# Patient Record
Sex: Male | Born: 1965 | Race: White | Hispanic: No | Marital: Married | Smoking: Never smoker
Health system: Southern US, Community
[De-identification: ages and names within clinical notes are randomized; demographics above are authoritative.]

## PROBLEM LIST (undated history)

## (undated) DIAGNOSIS — J302 Other seasonal allergic rhinitis: Secondary | ICD-10-CM

## (undated) DIAGNOSIS — Z720 Tobacco use: Secondary | ICD-10-CM

## (undated) DIAGNOSIS — E785 Hyperlipidemia, unspecified: Secondary | ICD-10-CM

## (undated) DIAGNOSIS — K219 Gastro-esophageal reflux disease without esophagitis: Secondary | ICD-10-CM

## (undated) DIAGNOSIS — J45909 Unspecified asthma, uncomplicated: Secondary | ICD-10-CM

## (undated) HISTORY — DX: Tobacco use: Z72.0

## (undated) HISTORY — DX: Gastro-esophageal reflux disease without esophagitis: K21.9

## (undated) HISTORY — DX: Other seasonal allergic rhinitis: J30.2

## (undated) HISTORY — DX: Hyperlipidemia, unspecified: E78.5

---

## 2017-10-31 DIAGNOSIS — J302 Other seasonal allergic rhinitis: Secondary | ICD-10-CM | POA: Diagnosis not present

## 2017-10-31 DIAGNOSIS — J4520 Mild intermittent asthma, uncomplicated: Secondary | ICD-10-CM | POA: Diagnosis not present

## 2017-10-31 DIAGNOSIS — Z1389 Encounter for screening for other disorder: Secondary | ICD-10-CM | POA: Diagnosis not present

## 2017-10-31 DIAGNOSIS — H6121 Impacted cerumen, right ear: Secondary | ICD-10-CM | POA: Diagnosis not present

## 2017-10-31 DIAGNOSIS — E7800 Pure hypercholesterolemia, unspecified: Secondary | ICD-10-CM | POA: Diagnosis not present

## 2017-10-31 DIAGNOSIS — Z1212 Encounter for screening for malignant neoplasm of rectum: Secondary | ICD-10-CM | POA: Diagnosis not present

## 2017-10-31 DIAGNOSIS — Z0000 Encounter for general adult medical examination without abnormal findings: Secondary | ICD-10-CM | POA: Diagnosis not present

## 2017-10-31 DIAGNOSIS — Z125 Encounter for screening for malignant neoplasm of prostate: Secondary | ICD-10-CM | POA: Diagnosis not present

## 2017-11-26 DIAGNOSIS — J019 Acute sinusitis, unspecified: Secondary | ICD-10-CM | POA: Diagnosis not present

## 2017-12-05 DIAGNOSIS — J302 Other seasonal allergic rhinitis: Secondary | ICD-10-CM | POA: Diagnosis not present

## 2017-12-06 DIAGNOSIS — L509 Urticaria, unspecified: Secondary | ICD-10-CM | POA: Diagnosis not present

## 2018-02-04 ENCOUNTER — Encounter (HOSPITAL_COMMUNITY): Payer: Self-pay | Admitting: Emergency Medicine

## 2018-02-04 ENCOUNTER — Emergency Department (HOSPITAL_COMMUNITY)
Admission: EM | Admit: 2018-02-04 | Discharge: 2018-02-04 | Disposition: A | Discharge status: Home / Self Care | Payer: Worker's Compensation | Attending: Emergency Medicine | Admitting: Emergency Medicine

## 2018-02-04 ENCOUNTER — Emergency Department (HOSPITAL_COMMUNITY): Payer: Worker's Compensation

## 2018-02-04 ENCOUNTER — Other Ambulatory Visit: Payer: Self-pay

## 2018-02-04 DIAGNOSIS — M79605 Pain in left leg: Secondary | ICD-10-CM | POA: Diagnosis present

## 2018-02-04 DIAGNOSIS — J45909 Unspecified asthma, uncomplicated: Secondary | ICD-10-CM | POA: Insufficient documentation

## 2018-02-04 DIAGNOSIS — R52 Pain, unspecified: Secondary | ICD-10-CM

## 2018-02-04 HISTORY — DX: Unspecified asthma, uncomplicated: J45.909

## 2018-02-04 LAB — D-DIMER, QUANTITATIVE (NOT AT ARMC): D DIMER QUANT: 1.05 ug{FEU}/mL — AB (ref 0.00–0.50)
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
## 2018-02-04 NOTE — Discharge Instructions (Addendum)
IMPORTANT PATIENT INSTRUCTIONS:  You have been scheduled for an Outpatient Vascular Study at [facility].   ° °If tomorrow is a Saturday, Sunday or holiday, please go to the [facility] Registration Desk at 8 am tomorrow morning and tell them you are there for a vascular study.  If tomorrow is a weekday (Monday-Friday), please go to [facility]mitting [facility] at 8 am and tell them  °you are  °there for a vascular study. ° °

## 2018-02-05 ENCOUNTER — Ambulatory Visit (HOSPITAL_COMMUNITY)
Admission: RE | Admit: 2018-02-05 | Discharge: 2018-02-05 | Disposition: A | Discharge status: Home / Self Care | Payer: Worker's Compensation | Source: Ambulatory Visit | Attending: Emergency Medicine | Admitting: Emergency Medicine

## 2018-02-05 DIAGNOSIS — M79662 Pain in left lower leg: Secondary | ICD-10-CM | POA: Diagnosis not present

## 2018-02-05 DIAGNOSIS — R52 Pain, unspecified: Secondary | ICD-10-CM | POA: Diagnosis not present

## 2018-02-05 NOTE — Progress Notes (Signed)
*  Preliminary Results* Left lower extremity venous duplex completed. Left lower extremity is negative for deep vein thrombosis. There is evidence of superficial vein thrombosis involving the left small saphenous vein. There is no evidence of left Baker's cyst.  Preliminary results discussed with Dr. Jacqulyn Bath- EDP.  02/05/2018 8:53 AM  Gertie Fey, MHA, RVT, RDCS, RDMS
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
## 2022-03-01 ENCOUNTER — Encounter: Payer: Self-pay | Admitting: Internal Medicine

## 2022-03-01 ENCOUNTER — Ambulatory Visit: Payer: Commercial Managed Care - PPO | Admitting: Internal Medicine

## 2022-04-03 ENCOUNTER — Encounter: Payer: Self-pay | Admitting: Internal Medicine

## 2022-04-03 ENCOUNTER — Ambulatory Visit: Payer: Commercial Managed Care - PPO | Admitting: Internal Medicine

## 2022-04-03 VITALS — BP 140/78 | HR 117 | Temp 98.1°F | Resp 18 | Ht 71.0 in | Wt 179.4 lb

## 2022-04-03 DIAGNOSIS — R051 Acute cough: Secondary | ICD-10-CM | POA: Insufficient documentation

## 2022-04-03 MED ORDER — PREDNISONE 20 MG PO TABS: 20.0000 mg | ORAL_TABLET | Freq: Every day | ORAL | 0 refills | Status: AC

## 2022-04-03 MED ORDER — HYDROCOD POLI-CHLORPHE POLI ER 10-8 MG/5ML PO SUER: 5.0000 mL | Freq: Two times a day (BID) | ORAL | 0 refills | Status: DC | PRN

## 2022-04-03 NOTE — Assessment & Plan Note (Signed)
He had a previous CXR by urgent care and he states this was clear.  He received antibiotics and at this point he is not having productive cough and he is not wheezing on my exam.  He does have some mild fluid and sinus congestion on questioning and I asked him to continue on flonase.  We will give him a short course of prednisone 20mg  daily x 5 days.  He does not want a repeat CXR but I do not think he has pneumonia on my exam.  We will give him tussionex for cough at night.  Continue supportive care.

## 2022-04-03 NOTE — Progress Notes (Signed)
Office Visit  Subjective   Patient ID: Mattew Chriswell   DOB: 10-05-1965   Age: 56 y.o.   MRN: [id number]   Chief Complaint Chief Complaint  Patient presents with   Acute Visit    cough     History of Present Illness The patient is a 56 year old male who presents with upper respiratory tract symptoms which began about 5 weeks ago where urgent care diagnosed him with influenza.  He did start out of tamilflu.  He continued to have cough and runny nose of both green and yellow discharge.  He went back to the urgent maybe a week later and they gave him antibiotics with levaquin.  He states his sinuses improved but he still has cough that is non-productive.  Today, he denies any fever, chills, chest congestion, SOB, wheezing, nausea, vomiting, diarrhea, sinus congestion or other problems.   Alleviating factors: he has tried robitussin but this is not helping.  He just started on flonase about 4 days ago. The patient's past medical history is significant for seasonal allergies and asthma.  He has been trying to use his albuterol as he thought maybe it was his asthma but this has not been helping.       Past Medical History Past Medical History:  Diagnosis Date   Asthma    GERD (gastroesophageal reflux disease)    Hyperlipidemia    Seasonal allergies    Snuff user      Allergies Allergies  Allergen Reactions   Omnicef [Cefdinir] Hives     Review of Systems Review of Systems  Constitutional:  Negative for chills and fever.  Respiratory:  Positive for cough. Negative for sputum production, shortness of breath and wheezing.   Cardiovascular:  Negative for chest pain, palpitations and leg swelling.  Gastrointestinal:  Negative for constipation, diarrhea, nausea and vomiting.  Musculoskeletal:  Negative for myalgias.  Skin:  Negative for itching and rash.  Neurological:  Negative for dizziness, weakness and headaches.       Objective:    Vitals BP (!) 140/78   Pulse (!) 117    Temp 98.1 F (36.7 C)   Resp 18   Ht 5\' 11"  (1.803 m)   Wt 179 lb 6.4 oz (81.4 kg)   SpO2 98%   BMI 25.02 kg/m    Physical Examination Physical Exam Constitutional:      Appearance: Normal appearance. He is not ill-appearing.  Cardiovascular:     Rate and Rhythm: Normal rate and regular rhythm.     Pulses: Normal pulses.     Heart sounds: No murmur heard.    No friction rub. No gallop.  Pulmonary:     Effort: Pulmonary effort is normal. No respiratory distress.     Breath sounds: No wheezing, rhonchi or rales.  Abdominal:     General: Abdomen is flat. Bowel sounds are normal. There is no distension.     Palpations: Abdomen is soft.     Tenderness: There is no abdominal tenderness.  Skin:    General: Skin is warm and dry.     Findings: No rash.  Neurological:     Mental Status: He is alert.        Assessment & Plan:   Acute cough He had a previous CXR by urgent care and he states this was clear.  He received antibiotics and at this point he is not having productive cough and he is not wheezing on my exam.  He does have some  mild fluid and sinus congestion on questioning and I asked him to continue on flonase.  We will give him a short course of prednisone 20mg  daily x 5 days.  He does not want a repeat CXR but I do not think he has pneumonia on my exam.  We will give him tussionex for cough at night.  Continue supportive care.    No follow-ups on file.   , MD

## 2022-05-06 ENCOUNTER — Other Ambulatory Visit: Payer: Self-pay | Admitting: Urology

## 2022-05-06 DIAGNOSIS — R972 Elevated prostate specific antigen [PSA]: Secondary | ICD-10-CM

## 2022-06-02 ENCOUNTER — Other Ambulatory Visit: Payer: Self-pay | Admitting: Internal Medicine

## 2022-06-08 ENCOUNTER — Ambulatory Visit
Admission: RE | Admit: 2022-06-08 | Discharge: 2022-06-08 | Disposition: A | Discharge status: Home / Self Care | Payer: Commercial Managed Care - PPO | Source: Ambulatory Visit | Attending: Urology | Admitting: Urology

## 2022-06-08 DIAGNOSIS — R972 Elevated prostate specific antigen [PSA]: Secondary | ICD-10-CM

## 2022-06-08 MED ORDER — GADOPICLENOL 0.5 MMOL/ML IV SOLN
8.0000 mL | Freq: Once | INTRAVENOUS | Status: AC | PRN
  Administered 2022-06-08: 8 mL via INTRAVENOUS

## 2022-06-12 ENCOUNTER — Other Ambulatory Visit: Payer: Self-pay | Admitting: Internal Medicine

## 2022-08-02 ENCOUNTER — Encounter: Payer: Self-pay | Admitting: Internal Medicine

## 2022-08-02 ENCOUNTER — Ambulatory Visit: Payer: Commercial Managed Care - PPO | Admitting: Internal Medicine

## 2022-08-02 VITALS — BP 120/88 | HR 67 | Temp 98.7°F | Resp 16 | Ht 71.0 in | Wt 171.4 lb

## 2022-08-02 DIAGNOSIS — E7800 Pure hypercholesterolemia, unspecified: Secondary | ICD-10-CM | POA: Insufficient documentation

## 2022-08-02 NOTE — Assessment & Plan Note (Signed)
He remains on pravastatin.  We will check a FLP on him today.

## 2022-08-02 NOTE — Progress Notes (Signed)
   Office Visit  Subjective   Patient ID: Larry Whitehead   DOB: 04/01/1966   Age: 57 y.o.   MRN: [id number]   Chief Complaint Chief Complaint  Patient presents with  . Follow-up    Cholesterol     History of Present Illness Larry Whitehead returns today for routine followup on his cholesterol.  We saw him 6 months ago showed a TC 188 and a LDL of 112.  and His ASCVD score from his 05/2019 FLP was 9.1% so we started him on pravastatin.  Overall, he states he is doing well and is without any complaints or problems at this time. He specifically denies abdominal pain, nausea, vomiting, diarrhea, myalgias, and fatigue. He remains on diet and pravastatin  po qhs.  He is fasting in anticipation for labs today.       Past Medical History Past Medical History:  Diagnosis Date  . Asthma   . GERD (gastroesophageal reflux disease)   . Hyperlipidemia   . Seasonal allergies   . Snuff user      Allergies Allergies  Allergen Reactions  . Omnicef [Cefdinir] Hives     Medications  Current Outpatient Medications:  .  pantoprazole (PROTONIX) 40 MG tablet, TAKE 1 TABLET BY MOUTH EVERY DAY, Disp: 90 tablet, Rfl: 1 .  pravastatin (PRAVACHOL) 40 MG tablet, TAKE 1 TABLET BY MOUTH EVERYDAY AT BEDTIME, Disp: 90 tablet, Rfl: 1   Review of Systems Review of Systems  Constitutional:  Negative for chills and fever.  Respiratory:  Negative for cough and shortness of breath.   Cardiovascular:  Negative for chest pain, palpitations and leg swelling.  Gastrointestinal:  Negative for abdominal pain, constipation, diarrhea, nausea and vomiting.  Musculoskeletal:  Negative for myalgias.  Neurological:  Negative for dizziness, weakness and headaches.       Objective:    Vitals BP 120/88   Pulse 67   Temp 98.7 F (37.1 C)   Resp 16   Ht  (1.803 m)   Wt 171 lb 6.4 oz (77.7 kg)   SpO2 99%   BMI 23.91 kg/m    Physical Examination Physical Exam Constitutional:      Appearance:  Normal appearance. He is not ill-appearing.  Cardiovascular:     Rate and Rhythm: Normal rate and regular rhythm.     Pulses: Normal pulses.     Heart sounds: No murmur heard.    No friction rub. No gallop.  Pulmonary:     Effort: Pulmonary effort is normal. No respiratory distress.     Breath sounds: No wheezing, rhonchi or rales.  Abdominal:     General: Abdomen is flat. Bowel sounds are normal. There is no distension.     Palpations: Abdomen is soft.     Tenderness: There is no abdominal tenderness.  Musculoskeletal:     Right lower leg: No edema.     Left lower leg: No edema.  Skin:    General: Skin is warm and dry.     Findings: No rash.  Neurological:     Mental Status: He is alert.       Assessment & Plan:   Hypercholesterolemia He remains on pravastatin.  We will check a FLP on him today.    Return in about 27 weeks (around 02/07/2023) for annual.   Crist Fat, MD

## 2022-08-06 LAB — LP+CBC/D/PLT+PSA+TESTT+E2+T...
Cholesterol, Total: 183 mg/dL (ref 100–199)
Estradiol: 15.7 pg/mL (ref 7.6–42.6)
HDL: 59 mg/dL (ref >39)
LDL Chol Calc (NIH): 102 mg/dL — ABNORMAL HIGH (ref 0–99)
Prostate Specific Ag, Serum: 3.7 ng/mL (ref 0.0–4.0)
Testosterone, Free: 4.6 pg/mL — ABNORMAL LOW (ref 7.2–24.0)
Testosterone: 314 ng/dL (ref 264–916)
Triglycerides: 127 mg/dL (ref 0–149)
VLDL Cholesterol Cal: 22 mg/dL (ref 5–40)

## 2022-08-06 LAB — LIPID PANEL: Chol/HDL Ratio: 3.1 ratio (ref 0.0–5.0)

## 2022-12-01 ENCOUNTER — Other Ambulatory Visit: Payer: Self-pay | Admitting: Internal Medicine

## 2023-02-14 ENCOUNTER — Ambulatory Visit: Payer: Commercial Managed Care - PPO | Admitting: Internal Medicine

## 2023-02-14 ENCOUNTER — Encounter: Payer: Self-pay | Admitting: Internal Medicine

## 2023-02-14 VITALS — BP 128/84 | HR 92 | Temp 97.9°F | Resp 18 | Ht 71.0 in | Wt 179.6 lb

## 2023-02-14 DIAGNOSIS — F17220 Nicotine dependence, chewing tobacco, uncomplicated: Secondary | ICD-10-CM | POA: Insufficient documentation

## 2023-02-14 DIAGNOSIS — E782 Mixed hyperlipidemia: Secondary | ICD-10-CM | POA: Insufficient documentation

## 2023-02-14 DIAGNOSIS — Z6825 Body mass index (BMI) 25.0-25.9, adult: Secondary | ICD-10-CM | POA: Insufficient documentation

## 2023-02-14 DIAGNOSIS — Z0000 Encounter for general adult medical examination without abnormal findings: Secondary | ICD-10-CM | POA: Insufficient documentation

## 2023-02-14 DIAGNOSIS — N400 Enlarged prostate without lower urinary tract symptoms: Secondary | ICD-10-CM | POA: Insufficient documentation

## 2023-02-14 DIAGNOSIS — K219 Gastro-esophageal reflux disease without esophagitis: Secondary | ICD-10-CM | POA: Insufficient documentation

## 2023-02-14 DIAGNOSIS — J4520 Mild intermittent asthma, uncomplicated: Secondary | ICD-10-CM | POA: Insufficient documentation

## 2023-02-14 DIAGNOSIS — J302 Other seasonal allergic rhinitis: Secondary | ICD-10-CM | POA: Insufficient documentation

## 2023-02-14 MED ORDER — PANTOPRAZOLE SODIUM 40 MG PO TBEC: 40.0000 mg | DELAYED_RELEASE_TABLET | Freq: Every day | ORAL | 1 refills | Status: DC

## 2023-02-14 NOTE — Progress Notes (Incomplete)
Office Visit  Subjective   Patient ID: Larry Whitehead   DOB: 1965-09-28   Age: 57 y.o.   MRN: [id number]   Chief Complaint Chief Complaint  Patient presents with  . Annual Exam     History of Present Illness Larry Whitehead is a 57 year old Caucasian/White male who presents for his annual health maintenance exam. He is due for the following health maintenance studies: visual exam and screening labs. This patient's past medical history Asthma, GERD, Hyperlipidemia, Rhinitis, Allergic, and Tobacco Abuse.   His last eye exam was in 10/2021 where they gave him new prescription glasses. There has been no change in his vision and they are following for a cataract. 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 He goes back tomorrow for another eye exam.  He had a colonoscopy in 10/2014 and this was normal and they want to repeat it again in 10 years. He denies any problems with urination. He has no history of BPH. He does exercise with walking 3-4 times per week. He does get yearly flu vaccines.  He finished his shingrix vaccine on 06/14/2022.  He has had 1 J&J COVID-19 vaccine. He denies any depression or anxiety. He is on an ASA 81mg  daily.   Larry Whitehead returns today for routine followup on his cholesterol.  We saw him this past year where his FLP showed a TC 188 and a LDL of 112.  and His ASCVD score from his 05/2019 FLP was 9.1% so we started him on pravastatin.  Overall, he states he is doing well and is without any complaints or problems at this time. He specifically denies abdominal pain, nausea, vomiting, diarrhea, myalgias, and fatigue. He remains on diet and pravastatin 40mg  po qhs.  He is fasting in anticipation for labs today.    He does have a history of allergies which usually affects in the spring and fall and he takes OTC Flonase as needed.  He remains on protonix  which he states does control his reflux.    The patient  also presents mild asthma of years duration and presents today for a status visit.  He states he was diagnosed with this about 15 years.  His asthma has been controlled and he has had to use it rarely this past year.   See PMH for summary of pulmonary history and lab reports for status of any previous PFTs: Asthma and Rhinitis, Allergic. Comorbid conditions : none. He has no baseline symptoms of asthma. The patient's condition is controlled by medications as summarized in the medication list on the face sheet. He has no modifiable risk factors. Specifically denied complaints: worsening exertional dyspnea, increased wheezing, pleuritic chest pain, hemoptysis, fever, and nocturnal wheezing. Interval history: no healthcare visits with other providers since last seen in this office. Interval studies: none.   He does continue to dip snuff and is ready to quit after he has had multiple discussions with his dentist.  He has tried Chantix but he stopped it due to nausea.  He also had vivid dreams with this.     Past Medical History Past Medical History:  Diagnosis Date  . Asthma   . GERD (gastroesophageal reflux disease)   . Hyperlipidemia   . Seasonal allergies   . Snuff user      Allergies Allergies  Allergen Reactions  . Omnicef [Cefdinir] Hives     Medications  Current Outpatient Medications:  .  tamsulosin (FLOMAX) 0.4 MG CAPS capsule, Take 0.4 mg by mouth at bedtime., Disp: , Rfl:  .  pantoprazole (PROTONIX) 40 MG tablet, TAKE 1 TABLET BY MOUTH EVERY DAY, Disp: 90 tablet, Rfl: 1 .  pravastatin (PRAVACHOL) 40 MG tablet, TAKE 1 TABLET BY MOUTH EVERYDAY AT BEDTIME, Disp: 90 tablet, Rfl: 1   Review of Systems Review of Systems  Constitutional:  Negative for chills, fever and malaise/fatigue.  Eyes:  Negative for blurred vision and double vision.  Respiratory:  Negative for cough, hemoptysis, shortness of breath and wheezing.   Cardiovascular:  Negative for chest pain, palpitations and leg  swelling.  Gastrointestinal:  Negative for abdominal pain, blood in stool, constipation, diarrhea, heartburn, melena, nausea and vomiting.  Genitourinary:  Negative for frequency and hematuria.  Musculoskeletal:  Negative for myalgias.  Skin:  Negative for itching and rash.  Neurological:  Negative for dizziness, weakness and headaches.  Endo/Heme/Allergies:  Negative for polydipsia.  Psychiatric/Behavioral:  Negative for depression. The patient is not nervous/anxious.        Objective:    Vitals BP 128/84   Pulse 92   Temp 97.9 F (36.6 C)   Resp 18   Ht 5\' 11"  (1.803 m)   Wt 179 lb 9.6 oz (81.5 kg)   SpO2 98%   BMI 25.05 kg/m    Physical Examination Physical Exam Constitutional:      Appearance: Normal appearance. He is not ill-appearing.  HENT:     Head: Normocephalic and atraumatic.     Right Ear: Tympanic membrane, ear canal and external ear normal.     Left Ear: Tympanic membrane, ear canal and external ear normal.     Nose: Nose normal. No congestion or rhinorrhea.     Mouth/Throat:     Mouth: Mucous membranes are moist.     Pharynx: Oropharynx is clear. No oropharyngeal exudate or posterior oropharyngeal erythema.  Eyes:     General: No scleral icterus.    Conjunctiva/sclera: Conjunctivae normal.     Pupils: Pupils are equal, round, and reactive to light.  Neck:     Vascular: No carotid bruit.  Cardiovascular:     Rate and Rhythm: Normal rate and regular rhythm.     Pulses: Normal pulses.     Heart sounds: No murmur heard.    No friction rub. No gallop.  Pulmonary:     Effort: Pulmonary effort is normal. No respiratory distress.     Breath sounds: No wheezing, rhonchi or rales.  Abdominal:     General: Abdomen is flat. Bowel sounds are normal. There is no distension.     Palpations: Abdomen is soft.     Tenderness: There is no abdominal tenderness.  Musculoskeletal:     Cervical back: Normal range of motion. No tenderness.     Right lower leg: No  edema.     Left lower leg: No edema.  Lymphadenopathy:     Cervical: No cervical adenopathy.  Skin:    General: Skin is warm and dry.     Findings: No rash.  Neurological:     General: No focal deficit present.     Mental Status: He is alert and oriented to person, place, and time.  Psychiatric:        Mood and Affect: Mood normal.        Behavior: Behavior normal.        Assessment & Plan:   Mild intermittent asthma without complication This is  not an issue and it does not really effect him.  Gastroesophageal reflux disease His GERD is controlled on protonix.  Benign prostatic hyperplasia without lower urinary tract symptoms He goes to see urology today wherre they are folowoing him for a history of elevated PSA and they have him on flomax for his BPH.  Annual physical exam Health maintenance discussed.  We will give him a flu shot today.  We will obtain some yearly labs.  BMI 25.0-25.9,adult I want him to eat healthy and continue to exercise.  Mixed hyperlipidemia We will check his FLP today as he on pravastatin.  Seasonal allergies Continue flonase and zyrtec as needed.  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  We will continue to have discussions.    Return in about 6 months (around 08/14/2023).   Crist Fat, MD

## 2023-02-14 NOTE — Assessment & Plan Note (Signed)
I have asked him to cut back and quit but he is not quite ready to do this.  We will continue to have discussions.

## 2023-02-14 NOTE — Assessment & Plan Note (Signed)
I want him to eat healthy and continue to exercise.

## 2023-02-14 NOTE — Assessment & Plan Note (Signed)
He goes to see urology today wherre they are folowoing him for a history of elevated PSA and they have him on flomax for his BPH.

## 2023-02-14 NOTE — Assessment & Plan Note (Signed)
His GERD is controlled on protonix.

## 2023-02-14 NOTE — Assessment & Plan Note (Signed)
This is not an issue and it does not really effect him.

## 2023-02-14 NOTE — Assessment & Plan Note (Signed)
Health maintenance discussed.  We will give him a flu shot today.  We will obtain some yearly labs.

## 2023-02-14 NOTE — Assessment & Plan Note (Signed)
We will check his FLP today as he on pravastatin.

## 2023-02-14 NOTE — Assessment & Plan Note (Signed)
Continue flonase and zyrtec as needed.

## 2023-02-15 LAB — CBC WITH DIFFERENTIAL/PLATELET
Basophils Absolute: 0.1 10*3/uL (ref 0.0–0.2)
Basos: 1 %
EOS (ABSOLUTE): 0.1 10*3/uL (ref 0.0–0.4)
Eos: 3 %
Hematocrit: 44.9 % (ref 37.5–51.0)
Hemoglobin: 14.9 g/dL (ref 13.0–17.7)
Immature Grans (Abs): 0 10*3/uL (ref 0.0–0.1)
Immature Granulocytes: 0 %
Lymphocytes Absolute: 1.5 10*3/uL (ref 0.7–3.1)
Lymphs: 27 %
MCH: 31.6 pg (ref 26.6–33.0)
MCHC: 33.2 g/dL (ref 31.5–35.7)
MCV: 95 fL (ref 79–97)
Monocytes Absolute: 0.4 10*3/uL (ref 0.1–0.9)
Monocytes: 7 %
Neutrophils Absolute: 3.4 10*3/uL (ref 1.4–7.0)
Neutrophils: 62 %
Platelets: 259 10*3/uL (ref 150–450)
RBC: 4.71 x10E6/uL (ref 4.14–5.80)
RDW: 12 % (ref 11.6–15.4)
WBC: 5.4 10*3/uL (ref 3.4–10.8)

## 2023-02-15 LAB — LIPID PANEL
Chol/HDL Ratio: 3 ratio (ref 0.0–5.0)
Cholesterol, Total: 187 mg/dL (ref 100–199)
HDL: 63 mg/dL (ref >39)
LDL Chol Calc (NIH): 102 mg/dL — ABNORMAL HIGH (ref 0–99)
Triglycerides: 127 mg/dL (ref 0–149)
VLDL Cholesterol Cal: 22 mg/dL (ref 5–40)

## 2023-02-15 LAB — CMP14 + ANION GAP
ALT: 24 [IU]/L (ref 0–44)
AST: 25 [IU]/L (ref 0–40)
Albumin: 4.7 g/dL (ref 3.8–4.9)
Alkaline Phosphatase: 106 [IU]/L (ref 44–121)
Anion Gap: 18 mmol/L (ref 10.0–18.0)
BUN/Creatinine Ratio: 12 (ref 9–20)
BUN: 12 mg/dL (ref 6–24)
Bilirubin Total: 0.6 mg/dL (ref 0.0–1.2)
CO2: 24 mmol/L (ref 20–29)
Calcium: 9.7 mg/dL (ref 8.7–10.2)
Chloride: 100 mmol/L (ref 96–106)
Creatinine, Ser: 1.01 mg/dL (ref 0.76–1.27)
Globulin, Total: 2.5 g/dL (ref 1.5–4.5)
Glucose: 116 mg/dL — ABNORMAL HIGH (ref 70–99)
Potassium: 4.9 mmol/L (ref 3.5–5.2)
Sodium: 142 mmol/L (ref 134–144)
Total Protein: 7.2 g/dL (ref 6.0–8.5)
eGFR: 86 mL/min/{1.73_m2} (ref >59)

## 2023-02-15 LAB — HEMOGLOBIN A1C
Est. average glucose Bld gHb Est-mCnc: 128 mg/dL
Hgb A1c MFr Bld: 6.1 % — ABNORMAL HIGH (ref 4.8–5.6)

## 2023-02-15 LAB — TSH: TSH: 3.85 u[IU]/mL (ref 0.450–4.500)

## 2023-03-18 NOTE — Progress Notes (Signed)
Why did we draw labs on him? He has not been seen in 8 months and these look like yearly labs?????? He is developed prediabetes- he needs to eat healthy and exericse. Get back to me why we sent labs.  We did labs because he had an annual on 02/14/23

## 2023-06-01 ENCOUNTER — Other Ambulatory Visit: Payer: Self-pay | Admitting: Internal Medicine

## 2023-08-15 ENCOUNTER — Ambulatory Visit: Payer: Commercial Managed Care - PPO | Admitting: Internal Medicine

## 2023-09-05 ENCOUNTER — Encounter: Payer: Self-pay | Admitting: Internal Medicine

## 2023-09-05 ENCOUNTER — Ambulatory Visit: Admitting: Internal Medicine

## 2023-09-05 VITALS — BP 124/82 | HR 89 | Temp 98.2°F | Resp 16 | Ht 71.0 in | Wt 175.6 lb

## 2023-09-05 DIAGNOSIS — E782 Mixed hyperlipidemia: Secondary | ICD-10-CM

## 2023-09-05 NOTE — Progress Notes (Signed)
 Office Visit  Subjective   Patient ID: Larry Whitehead   DOB: [birth year]   Age: 58 y.o.   MRN: [id number]   Chief Complaint Chief Complaint  Patient presents with   Follow-up     History of Present Illness Larry Whitehead returns today for routine followup on his cholesterol.  Since his last visit, he has had no problems.  He has a history of mixed hyperlipidemia.Larry Whitehead  and His ASCVD score from his 05/2019 FLP was 9.1% so we started him on pravastatin.  Overall, he states he is doing well and is without any complaints or problems at this time. He specifically denies abdominal pain, nausea, vomiting, diarrhea, myalgias, and fatigue. He remains on diet and pravastatin 40mg  po qhs.  He is fasting in anticipation for labs today.     Past Medical History Past Medical History:  Diagnosis Date   Asthma    GERD (gastroesophageal reflux disease)    Hyperlipidemia    Seasonal allergies    Snuff user      Allergies Allergies  Allergen Reactions   Omnicef [Cefdinir] Hives     Medications  Current Outpatient Medications:    pantoprazole  (PROTONIX ) 40 MG tablet, Take 1 tablet (40 mg total) by mouth daily., Disp: 90 tablet, Rfl: 1   pravastatin (PRAVACHOL) 40 MG tablet, TAKE 1 TABLET BY MOUTH EVERYDAY AT BEDTIME, Disp: 90 tablet, Rfl: 1   tamsulosin (FLOMAX) 0.4 MG CAPS capsule, Take 0.4 mg by mouth at bedtime., Disp: , Rfl:    Review of Systems Review of Systems  Constitutional:  Negative for chills and fever.  Eyes:  Negative for blurred vision.  Respiratory:  Negative for shortness of breath.   Cardiovascular:  Negative for chest pain, palpitations and leg swelling.  Gastrointestinal:  Negative for abdominal pain, constipation, diarrhea, nausea and vomiting.  Neurological:  Negative for dizziness, weakness and headaches.       Objective:    Vitals BP 124/82   Pulse 89   Temp 98.2 F (36.8 C)   Resp 16   Ht 5\' 11"  (1.803 m)   Wt 175 lb 9.6 oz (79.7 kg)   SpO2 97%   BMI  24.49 kg/m    Physical Examination Physical Exam Constitutional:      Appearance: Normal appearance. He is not ill-appearing.  Cardiovascular:     Rate and Rhythm: Normal rate and regular rhythm.     Pulses: Normal pulses.     Heart sounds: No murmur heard.    No friction rub. No gallop.  Pulmonary:     Effort: Pulmonary effort is normal. No respiratory distress.     Breath sounds: No wheezing, rhonchi or rales.  Abdominal:     General: Abdomen is flat. Bowel sounds are normal. There is no distension.     Palpations: Abdomen is soft.     Tenderness: There is no abdominal tenderness.  Musculoskeletal:     Right lower leg: No edema.     Left lower leg: No edema.  Skin:    General: Skin is warm and dry.     Findings: No rash.  Neurological:     Mental Status: He is alert.        Assessment & Plan:   Mixed hyperlipidemia We will check his FLP today.  He remains on pravastatin.  We will see him back in 6 months for an annual exam.    Return in about 6 months (around 03/07/2024) for annual.   Larry Whitehead  Eyk, MD

## 2023-09-05 NOTE — Assessment & Plan Note (Signed)
 We will check his FLP today.  He remains on pravastatin.  We will see him back in 6 months for an annual exam.

## 2023-09-12 LAB — CMP14 + ANION GAP
ALT: 16 IU/L (ref 0–44)
AST: 20 IU/L (ref 0–40)
Albumin: 4.8 g/dL (ref 3.8–4.9)
Alkaline Phosphatase: 107 IU/L (ref 44–121)
Anion Gap: 17 mmol/L (ref 10.0–18.0)
BUN/Creatinine Ratio: 17 (ref 9–20)
BUN: 17 mg/dL (ref 6–24)
Bilirubin Total: 0.6 mg/dL (ref 0.0–1.2)
CO2: 23 mmol/L (ref 20–29)
Calcium: 10 mg/dL (ref 8.7–10.2)
Chloride: 102 mmol/L (ref 96–106)
Creatinine, Ser: 0.99 mg/dL (ref 0.76–1.27)
Globulin, Total: 2.5 g/dL (ref 1.5–4.5)
Glucose: 114 mg/dL — ABNORMAL HIGH (ref 70–99)
Potassium: 4.9 mmol/L (ref 3.5–5.2)
Sodium: 142 mmol/L (ref 134–144)
Total Protein: 7.3 g/dL (ref 6.0–8.5)
eGFR: 88 mL/min/{1.73_m2} (ref >59)

## 2023-09-12 LAB — LIPID PANEL
Chol/HDL Ratio: 3.4 ratio (ref 0.0–5.0)
Cholesterol, Total: 196 mg/dL (ref 100–199)
HDL: 58 mg/dL (ref >39)
LDL Chol Calc (NIH): 119 mg/dL — ABNORMAL HIGH (ref 0–99)
Triglycerides: 105 mg/dL (ref 0–149)
VLDL Cholesterol Cal: 19 mg/dL (ref 5–40)

## 2023-09-12 LAB — SPECIMEN STATUS REPORT

## 2023-09-15 ENCOUNTER — Ambulatory Visit: Payer: Self-pay

## 2023-09-15 NOTE — Progress Notes (Signed)
 Patient called.  Patient aware.  The patient returned my phone call and I have informed him "Increase his pravastatin from 40mg  to 80mg  at bedtime.".  The patient stated he has 3 bottles left of his 40 mg Pravastatin so he will take 2 pills to equal 80 mg.

## 2023-09-15 NOTE — Progress Notes (Signed)
 Patient called.  Left message for patient to call back.  I have called and left the patient a voicemail to return our phone call. The patient needs to be informed "Increase his pravastatin from 40mg  to 80mg  at bedtime.".

## 2023-10-04 ENCOUNTER — Other Ambulatory Visit: Payer: Self-pay | Admitting: Internal Medicine

## 2023-10-30 ENCOUNTER — Telehealth: Payer: Self-pay | Admitting: Cardiology

## 2023-10-30 NOTE — Telephone Encounter (Signed)
 Patient needs an appt with R[facility] per RJK/kbl 10/30/23

## 2023-10-30 NOTE — Telephone Encounter (Signed)
 LVM for pt to call and schedule an appt with RJK/kbl 10/30/23

## 2023-11-03 NOTE — Telephone Encounter (Signed)
 Called patient and informed patient consult was not needed after all/kbl 11/03/23

## 2023-11-30 ENCOUNTER — Other Ambulatory Visit: Payer: Self-pay | Admitting: Internal Medicine

## 2024-02-27 ENCOUNTER — Encounter: Admitting: Internal Medicine

## 2024-03-05 ENCOUNTER — Ambulatory Visit: Admitting: Internal Medicine

## 2024-03-08 ENCOUNTER — Ambulatory Visit: Admitting: Internal Medicine

## 2024-03-08 ENCOUNTER — Encounter: Payer: Self-pay | Admitting: Internal Medicine

## 2024-03-08 VITALS — BP 130/80 | HR 72 | Temp 97.5°F | Resp 18 | Ht 71.0 in | Wt 174.2 lb

## 2024-03-08 DIAGNOSIS — J4520 Mild intermittent asthma, uncomplicated: Secondary | ICD-10-CM

## 2024-03-08 DIAGNOSIS — Z23 Encounter for immunization: Secondary | ICD-10-CM | POA: Diagnosis not present

## 2024-03-08 DIAGNOSIS — J302 Other seasonal allergic rhinitis: Secondary | ICD-10-CM

## 2024-03-08 DIAGNOSIS — E782 Mixed hyperlipidemia: Secondary | ICD-10-CM | POA: Diagnosis not present

## 2024-03-08 DIAGNOSIS — N400 Enlarged prostate without lower urinary tract symptoms: Secondary | ICD-10-CM | POA: Diagnosis not present

## 2024-03-08 DIAGNOSIS — R7303 Prediabetes: Secondary | ICD-10-CM | POA: Diagnosis not present

## 2024-03-08 DIAGNOSIS — Z0000 Encounter for general adult medical examination without abnormal findings: Secondary | ICD-10-CM | POA: Diagnosis not present

## 2024-03-08 DIAGNOSIS — K219 Gastro-esophageal reflux disease without esophagitis: Secondary | ICD-10-CM | POA: Diagnosis not present

## 2024-03-08 MED ORDER — TETANUS-DIPHTHERIA TOXOIDS TD 2-2 LF/0.5ML IM SUSP: 0.5000 mL | Freq: Once | INTRAMUSCULAR | 0 refills | Status: AC

## 2024-03-08 MED ORDER — MONTELUKAST SODIUM 10 MG PO TABS: 10.0000 mg | ORAL_TABLET | Freq: Every day | ORAL | 2 refills | Status: AC

## 2024-03-08 MED ORDER — FLUTICASONE PROPIONATE 50 MCG/ACT NA SUSP: 1.0000 | Freq: Every day | NASAL | 2 refills | Status: AC

## 2024-03-08 MED ORDER — PNEUMOCOCCAL 13-VAL CONJ VACC IM SUSP: 0.5000 mL | INTRAMUSCULAR | 0 refills | Status: AC

## 2024-03-08 NOTE — Assessment & Plan Note (Signed)
 He takes Flomax 0.4 mg daily and follows with the urologist.

## 2024-03-08 NOTE — Assessment & Plan Note (Signed)
 I will do hemoglobin A1c and he will continue to watch his diet.

## 2024-03-08 NOTE — Assessment & Plan Note (Signed)
 I will start him on montelukast  10 mg daily.  He will call if his symptoms are not better.

## 2024-03-08 NOTE — Assessment & Plan Note (Signed)
 I will send prescription of pneumonia and tetanus booster to his pharmacy.  He will have repeat colonoscopy next year.  He will follow-up with urologist and they are monitoring his PSA.

## 2024-03-08 NOTE — Assessment & Plan Note (Signed)
 He takes pravastatin 40 mg daily and I will do lipid panel as he is fasting today.

## 2024-03-08 NOTE — Assessment & Plan Note (Signed)
 He will use fluticasone  1 spray to each nostril at nighttime.  He take over-the-counter Zyrtec as well.

## 2024-03-08 NOTE — Assessment & Plan Note (Signed)
 He will continue with pantoprazole  40 mg daily.

## 2024-03-08 NOTE — Assessment & Plan Note (Signed)
-   Will give a flu shot today

## 2024-03-08 NOTE — Progress Notes (Signed)
 Office Visit  Subjective   Patient ID: Larry Whitehead   DOB: 1965-04-24   Age: 58 y.o.   MRN: [id number]   Chief Complaint Chief Complaint  Patient presents with   Annual Exam    Annual exam      History of Present Illness 58 years old male is here for annual physical examination. He is married, he does not smoke but he dips. He occasionally drink alcohol . 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  His father died of heart attack. Mother died of breast cancer. His brother died of bladder cancer, his sister has breast cancer.   He just has flu shot. He has first COVID vaccine but no booster. He has two shingle vaccine. He never has pneumonia vaccine. His tetanus booster was more than 10 years ago.   He has colonoscopy 10 years ago and is due for repeat colonoscopy next year. He saw urologist for prostate examination.  And they are monitoring his PSA.  He has borderline diabetes. His Hb A1c was 6.1. His mother has diabetes. He watch his diet.  He has seasonal allergies, He is not using any nasal spray. He has asthma and his symptoms are well controlled.   He did not take any inhaler.  He says that he is not sure what is going on that he vomited in the morning before he go to work.  He has no vomiting on the weekend.  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   Past Medical History Past Medical History:  Diagnosis Date   Asthma    GERD (gastroesophageal reflux disease)    Hyperlipidemia    Seasonal allergies    Snuff user      Allergies Allergies  Allergen Reactions   Omnicef [Cefdinir] Hives     Review of Systems Review of Systems  Constitutional: Negative.   HENT:  Positive for congestion.   Respiratory: Negative.    Cardiovascular: Negative.   Gastrointestinal:  Positive for vomiting.  Neurological: Negative.        Objective:    Vitals BP 130/80   Pulse 72   Temp (!) 97.5 F (36.4 C)   Resp 18   Ht 5' 11 (1.803 m)   Wt 174 lb 4 oz (79 kg)   SpO2 97%    BMI 24.30 kg/m    Physical Examination Physical Exam Constitutional:      Appearance: Normal appearance.  HENT:     Head: Normocephalic and atraumatic.  Eyes:     Extraocular Movements: Extraocular movements intact.     Pupils: Pupils are equal, round, and reactive to light.  Cardiovascular:     Rate and Rhythm: Normal rate and regular rhythm.     Heart sounds: Normal heart sounds.  Pulmonary:     Effort: Pulmonary effort is normal.     Breath sounds: Normal breath sounds.  Abdominal:     General: Bowel sounds are normal.     Palpations: Abdomen is soft.  Neurological:     General: No focal deficit present.     Mental Status: He is alert and oriented to person, place, and time.        Assessment & Plan:   Mild intermittent asthma without complication   I will start him on montelukast  10 mg daily.  He will call if his symptoms are not better.  Gastroesophageal reflux disease   He will continue with pantoprazole  40 mg daily.  Benign prostatic hyperplasia without lower  urinary tract symptoms   He takes Flomax 0.4 mg daily and follows with the urologist.  Seasonal allergies   He will use fluticasone  1 spray to each nostril at nighttime.  He take over-the-counter Zyrtec as well.  Mixed hyperlipidemia   He takes pravastatin 40 mg daily and I will do lipid panel as he is fasting today.  Flu vaccine need   Will give a flu shot today  Borderline diabetes mellitus  I will do hemoglobin A1c and he will continue to watch his diet.  Annual physical exam   I will send prescription of pneumonia and tetanus booster to his pharmacy.  He will have repeat colonoscopy next year.  He will follow-up with urologist and they are monitoring his PSA.    Return in about 3 months (around 06/08/2024).   Roetta Dare, MD

## 2024-03-09 LAB — CMP14 + ANION GAP
ALT: 17 IU/L (ref 0–44)
AST: 18 IU/L (ref 0–40)
Albumin: 4.7 g/dL (ref 3.8–4.9)
Alkaline Phosphatase: 97 IU/L (ref 47–123)
Anion Gap: 13 mmol/L (ref 10.0–18.0)
BUN/Creatinine Ratio: 17 (ref 10–24)
BUN: 15 mg/dL (ref 8–27)
Bilirubin Total: 0.4 mg/dL (ref 0.0–1.2)
CO2: 24 mmol/L (ref 20–29)
Calcium: 9.7 mg/dL (ref 8.6–10.2)
Chloride: 103 mmol/L (ref 96–106)
Creatinine, Ser: 0.86 mg/dL (ref 0.76–1.27)
Globulin, Total: 2.4 g/dL (ref 1.5–4.5)
Glucose: 104 mg/dL — ABNORMAL HIGH (ref 70–99)
Potassium: 4.5 mmol/L (ref 3.5–5.2)
Sodium: 140 mmol/L (ref 134–144)
Total Protein: 7.1 g/dL (ref 6.0–8.5)
eGFR: 99 mL/min/1.73 (ref >59)

## 2024-03-09 LAB — LIPID PANEL
Chol/HDL Ratio: 2.4 ratio (ref 0.0–5.0)
Cholesterol, Total: 160 mg/dL (ref 100–199)
HDL: 67 mg/dL (ref >39)
LDL Chol Calc (NIH): 76 mg/dL (ref 0–99)
Triglycerides: 93 mg/dL (ref 0–149)
VLDL Cholesterol Cal: 17 mg/dL (ref 5–40)

## 2024-03-09 LAB — CBC WITH DIFFERENTIAL/PLATELET
Basophils Absolute: 0.1 x10E3/uL (ref 0.0–0.2)
Basos: 1 %
EOS (ABSOLUTE): 0 x10E3/uL (ref 0.0–0.4)
Eos: 1 %
Hematocrit: 44.2 % (ref 37.5–51.0)
Hemoglobin: 14.7 g/dL (ref 13.0–17.7)
Immature Grans (Abs): 0 x10E3/uL (ref 0.0–0.1)
Immature Granulocytes: 0 %
Lymphocytes Absolute: 1.5 x10E3/uL (ref 0.7–3.1)
Lymphs: 26 %
MCH: 32 pg (ref 26.6–33.0)
MCHC: 33.3 g/dL (ref 31.5–35.7)
MCV: 96 fL (ref 79–97)
Monocytes Absolute: 0.3 x10E3/uL (ref 0.1–0.9)
Monocytes: 5 %
Neutrophils Absolute: 3.8 x10E3/uL (ref 1.4–7.0)
Neutrophils: 67 %
Platelets: 238 x10E3/uL (ref 150–450)
RBC: 4.59 x10E6/uL (ref 4.14–5.80)
RDW: 11.9 % (ref 11.6–15.4)
WBC: 5.7 x10E3/uL (ref 3.4–10.8)

## 2024-03-09 LAB — HEMOGLOBIN A1C
Est. average glucose Bld gHb Est-mCnc: 123 mg/dL
Hgb A1c MFr Bld: 5.9 % — ABNORMAL HIGH (ref 4.8–5.6)

## 2024-03-09 LAB — VITAMIN D 25 HYDROXY (VIT D DEFICIENCY, FRACTURES): Vit D, 25-Hydroxy: 27.7 ng/mL — ABNORMAL LOW (ref 30.0–100.0)

## 2024-03-09 LAB — TSH: TSH: 3.64 u[IU]/mL (ref 0.450–4.500)

## 2024-03-10 ENCOUNTER — Ambulatory Visit: Payer: Self-pay

## 2024-03-19 NOTE — Progress Notes (Signed)
 Patient called.  Unable to reach patient. Sending a letter   Dr caleen : Labs are good except vitamin D  is low. Need to take 5000 daily for 3 months then once a week

## 2024-04-29 ENCOUNTER — Other Ambulatory Visit: Payer: Self-pay | Admitting: Internal Medicine

## 2024-04-29 MED ORDER — PRAVASTATIN SODIUM 40 MG PO TABS: 40.0000 mg | ORAL_TABLET | Freq: Every day | ORAL | 1 refills | Status: AC

## 2024-06-07 ENCOUNTER — Ambulatory Visit: Admitting: Internal Medicine
